# Patient Record
Sex: Female | Born: 1995 | Race: Black or African American | Hispanic: No | Marital: Single | Smoking: Never smoker
Health system: Southern US, Community
[De-identification: ages and names within clinical notes are randomized; demographics above are authoritative.]

## PROBLEM LIST (undated history)

## (undated) DIAGNOSIS — R569 Unspecified convulsions: Secondary | ICD-10-CM

---
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
## 2015-03-28 ENCOUNTER — Encounter (HOSPITAL_COMMUNITY): Payer: Self-pay | Admitting: Emergency Medicine

## 2015-03-28 ENCOUNTER — Emergency Department (HOSPITAL_COMMUNITY)
Admission: EM | Admit: 2015-03-28 | Discharge: 2015-03-29 | Disposition: A | Discharge status: Home / Self Care | Payer: Commercial Managed Care - PPO | Attending: Emergency Medicine | Admitting: Emergency Medicine

## 2015-03-28 DIAGNOSIS — H538 Other visual disturbances: Secondary | ICD-10-CM | POA: Diagnosis not present

## 2015-03-28 DIAGNOSIS — R0602 Shortness of breath: Secondary | ICD-10-CM | POA: Diagnosis not present

## 2015-03-28 DIAGNOSIS — Z3202 Encounter for pregnancy test, result negative: Secondary | ICD-10-CM | POA: Diagnosis not present

## 2015-03-28 DIAGNOSIS — G4700 Insomnia, unspecified: Secondary | ICD-10-CM | POA: Insufficient documentation

## 2015-03-28 DIAGNOSIS — M791 Myalgia, unspecified site: Secondary | ICD-10-CM

## 2015-03-28 DIAGNOSIS — R079 Chest pain, unspecified: Secondary | ICD-10-CM | POA: Insufficient documentation

## 2015-03-28 DIAGNOSIS — G40909 Epilepsy, unspecified, not intractable, without status epilepticus: Secondary | ICD-10-CM | POA: Insufficient documentation

## 2015-03-28 DIAGNOSIS — R42 Dizziness and giddiness: Secondary | ICD-10-CM | POA: Insufficient documentation

## 2015-03-28 DIAGNOSIS — R112 Nausea with vomiting, unspecified: Secondary | ICD-10-CM | POA: Diagnosis not present

## 2015-03-28 DIAGNOSIS — R109 Unspecified abdominal pain: Secondary | ICD-10-CM | POA: Diagnosis not present

## 2015-03-28 DIAGNOSIS — G4489 Other headache syndrome: Secondary | ICD-10-CM | POA: Insufficient documentation

## 2015-03-28 DIAGNOSIS — R110 Nausea: Secondary | ICD-10-CM

## 2015-03-28 HISTORY — DX: Unspecified convulsions: R56.9

## 2015-03-28 LAB — URINALYSIS, ROUTINE W REFLEX MICROSCOPIC
BILIRUBIN URINE: NEGATIVE
Glucose, UA: NEGATIVE mg/dL
HGB URINE DIPSTICK: NEGATIVE
Ketones, ur: 15 mg/dL — AB
NITRITE: NEGATIVE
PROTEIN: NEGATIVE mg/dL
Specific Gravity, Urine: 1.026 (ref 1.005–1.030)
UROBILINOGEN UA: 1 mg/dL (ref 0.0–1.0)
pH: 5.5 (ref 5.0–8.0)

## 2015-03-28 LAB — COMPREHENSIVE METABOLIC PANEL
ALBUMIN: 3.9 g/dL (ref 3.5–5.0)
ALK PHOS: 92 U/L (ref 38–126)
ALT: 18 U/L (ref 14–54)
ANION GAP: 13 (ref 5–15)
AST: 24 U/L (ref 15–41)
BILIRUBIN TOTAL: 0.4 mg/dL (ref 0.3–1.2)
BUN: 10 mg/dL (ref 6–20)
CALCIUM: 9.6 mg/dL (ref 8.9–10.3)
CO2: 23 mmol/L (ref 22–32)
Chloride: 100 mmol/L — ABNORMAL LOW (ref 101–111)
Creatinine, Ser: 1.04 mg/dL — ABNORMAL HIGH (ref 0.44–1.00)
GLUCOSE: 112 mg/dL — AB (ref 65–99)
POTASSIUM: 3.2 mmol/L — AB (ref 3.5–5.1)
Sodium: 136 mmol/L (ref 135–145)
TOTAL PROTEIN: 8 g/dL (ref 6.5–8.1)

## 2015-03-28 LAB — I-STAT BETA HCG BLOOD, ED (MC, WL, AP ONLY): I-stat hCG, quantitative: <5 m[IU]/mL (ref <5)

## 2015-03-28 LAB — LIPASE, BLOOD: Lipase: 41 U/L (ref 11–51)

## 2015-03-28 LAB — CBC
HCT: 37.7 % (ref 36.0–46.0)
HEMOGLOBIN: 12.6 g/dL (ref 12.0–15.0)
MCH: 31.4 pg (ref 26.0–34.0)
MCHC: 33.4 g/dL (ref 30.0–36.0)
MCV: 94 fL (ref 78.0–100.0)
Platelets: 400 10*3/uL (ref 150–400)
RBC: 4.01 MIL/uL (ref 3.87–5.11)
RDW: 12.7 % (ref 11.5–15.5)
WBC: 8.2 10*3/uL (ref 4.0–10.5)

## 2015-03-28 LAB — URINE MICROSCOPIC-ADD ON

## 2015-03-28 MED ORDER — ONDANSETRON 4 MG PO TBDP
8.0000 mg | ORAL_TABLET | Freq: Once | ORAL | Status: AC
  Administered 2015-03-29: 8 mg via ORAL
  Filled 2015-03-28: qty 2

## 2015-03-28 NOTE — ED Provider Notes (Signed)
CSN: [id number]     Arrival date & time 03/28/15  2137 History  By signing my name below, I, Tanda Rockers, attest that this documentation has been prepared under the direction and in the presence of Zadie Rhine, MD. Electronically Signed: Tanda Rockers, ED Scribe. 03/28/2015. 11:57 PM.  Chief Complaint  Patient presents with  . Nausea  . Insomnia  . Visual Field Change   The history is provided by the patient. No language interpreter was used.     HPI Comments: Hannah Shields is a 19 y.o. female with hx seizures who presents to the Emergency Department complaining of gradual onset, intermittent, severe headache, nausea, vomiting, intermittent blurry vision, jerking arm movements, dizziness, and insomnia x 1 week. Pt states that these symptoms usually precede her seizures and she is concerned she may have one again soon. Pt takes Lamictal for her seizures and states she has been complaint with it.  She notes 1 episode of double vision that occurred a few days ago as well. She also complains of intermittent mid chest pain, shortness of breath, and abdominal pain. Pt states that the chest pain has not been typical of symptoms prior to having a seizure. Pt spoke to her neurologist in [place] today and was told she should be evaluated if symptoms persist. She attends college in the area but has not established with a neurologist here. Denies vision loss, arthralgias, diarrhea, fever, numbness or weakness in extremities, or any other associated symptoms. Pt states she has been working out more frequently for the past week. No recent drug use or EtOH use.    Past Medical History  Diagnosis Date  . Seizures (HCC)    History reviewed. No pertinent past surgical history. Family History  Problem Relation Age of Onset  . Cancer Maternal Grandfather   . Seizures Paternal Grandmother   . Kidney disease Paternal Grandfather    Social History  Substance Use Topics  . Smoking status: Never Smoker    . Smokeless tobacco: None  . Alcohol Use: No   OB History    No data available     Review of Systems  Constitutional: Negative for fever.  Eyes: Positive for visual disturbance (Blurred vision. Double vision. ).       Negative for loss of vision  Respiratory: Positive for shortness of breath.   Cardiovascular: Positive for chest pain.  Gastrointestinal: Positive for nausea, vomiting and abdominal pain. Negative for diarrhea.  Genitourinary: Negative for vaginal bleeding and vaginal discharge.  Musculoskeletal: Negative for arthralgias.  Neurological: Positive for dizziness and headaches. Negative for seizures, weakness and numbness.       + Jerking arm movements  Psychiatric/Behavioral: Positive for sleep disturbance.  All other systems reviewed and are negative.  Allergies  Review of patient's allergies indicates no known allergies.  Home Medications   Prior to Admission medications   Medication Sig Start Date End Date Taking? Authorizing Provider  LamoTRIgine (LAMICTAL XR) 300 MG TB24 Take 1 tablet (300 mg total) by mouth at bedtime. 02/12/13  Yes Micki Riley, MD  PRESCRIPTION MEDICATION Take 1 tablet by mouth daily. Birth Control   Yes Historical Provider, MD   Triage Vitals: BP 122/77 mmHg  Pulse 97  Temp(Src) 98.4 F (36.9 C) (Oral)  Resp 11  Ht  (1.626 m)  Wt 176 lb 6.4 oz (80.015 kg)  BMI 30.26 kg/m2  SpO2 100%  LMP 03/10/2015 (Exact Date)   Physical Exam  Nursing note and vitals reviewed.  CONSTITUTIONAL:  Well developed/well nourished HEAD: Normocephalic/atraumatic EYES: EOMI/PERRL, no nystagmus, no ptosis, No visual field deficit  ENMT: Mucous membranes moist NECK: supple no meningeal signs, no bruits SPINE/BACK:entire spine nontender CV: S1/S2 noted, no murmurs/rubs/gallops noted LUNGS: Lungs are clear to auscultation bilaterally, no apparent distress ABDOMEN: soft, nontender, no rebound or guarding GU:no cva tenderness NEURO:Awake/alert, face  symmetric, no arm or leg drift is noted Equal 5/5 strength with shoulder abduction, elbow flex/extension, wrist flex/extension in upper extremities and equal hand grips bilaterally Equal 5/5 strength with hip flexion,knee flex/extension, foot dorsi/plantar flexion Cranial nerves 3/4/5/6/11/26/08/11/12 tested and intact Gait normal without ataxia No past pointing Sensation to light touch intact in all extremities EXTREMITIES: pulses normal, full ROM SKIN: warm, color normal PSYCH: no abnormalities of mood noted, alert and oriented to situation   ED Course  Procedures   DIAGNOSTIC STUDIES: Oxygen Saturation is 100% on RA, normal by my interpretation.    COORDINATION OF CARE: 11:54 PM-Discussed treatment plan which includes Zofran and CK with pt at bedside and pt agreed to plan.  1:14 AM Pt well appearing Watching TV and smiling No neuro deficits She is ambulatory No significant alteration in her labs Stable for d/c home Given neuro referral Given SZ precautions   Labs Review Labs Reviewed  COMPREHENSIVE METABOLIC PANEL - Abnormal; Notable for the following:    Potassium 3.2 (*)    Chloride 100 (*)    Glucose, Bld 112 (*)    Creatinine, Ser 1.04 (*)    All other components within normal limits  URINALYSIS, ROUTINE W REFLEX MICROSCOPIC (NOT AT [facility]) - Abnormal; Notable for the following:    APPearance CLOUDY (*)    Ketones, ur 15 (*)    Leukocytes, UA SMALL (*)    All other components within normal limits  URINE MICROSCOPIC-ADD ON - Abnormal; Notable for the following:    Squamous Epithelial / LPF FEW (*)    Bacteria, UA FEW (*)    All other components within normal limits  CK - Abnormal; Notable for the following:    Total CK 349 (*)    All other components within normal limits  LIPASE, BLOOD  CBC  I-STAT BETA HCG BLOOD, ED ([facility], [facility], [facility] ONLY)  I-STAT BETA HCG BLOOD, ED ([facility], [facility], [facility] ONLY)    I have personally reviewed and evaluated these lab results as part of my  medical decision-making.   EKG Interpretation   Date/Time:  Tuesday March 29 2015 01:05:16 EST Ventricular Rate:  80 PR Interval:  129 QRS Duration: 99 QT Interval:  349 QTC Calculation: 402 R Axis:   64 Text Interpretation:  Sinus rhythm RSR' in V1 or V2, right VCD or RVH ECG  OTHERWISE WITHIN NORMAL LIMITS No previous ECGs available Confirmed by  Bebe ShaggyWICKLINE  MD, Yahmir Sokolov ([id number]) on 03/29/2015 1:11:12 AM      MDM   Final diagnoses:  Nausea  Other headache syndrome  Non-intractable vomiting with nausea, vomiting of unspecified type  Myalgia  Chest pain, unspecified chest pain type  Abdominal pain, unspecified abdominal location    Nursing notes including past medical history and social history reviewed and considered in documentation Labs/vital reviewed myself and considered during evaluation Previous records reviewed and considered   I personally performed the services described in this documentation, which was scribed in my presence. The recorded information has been reviewed and is accurate.        Zadie Rhineonald Zhaire Locker, MD 03/29/15 [phone]

## 2015-03-28 NOTE — ED Notes (Signed)
Patient here with complaint of nausea, blurred vision, jerking arm movements, and insomnia. States history of epilepsy and takes Lamictal. Reports that she has not missed any doses. Presents tonight because the symptoms she is feeling typically precede her seizures. Onset was 1 week ago.

## 2015-03-29 ENCOUNTER — Other Ambulatory Visit: Payer: Self-pay | Admitting: Neurology

## 2015-03-29 ENCOUNTER — Telehealth: Payer: Self-pay | Admitting: Neurology

## 2015-03-29 ENCOUNTER — Other Ambulatory Visit (INDEPENDENT_AMBULATORY_CARE_PROVIDER_SITE_OTHER): Payer: Self-pay

## 2015-03-29 DIAGNOSIS — G40309 Generalized idiopathic epilepsy and epileptic syndromes, not intractable, without status epilepticus: Secondary | ICD-10-CM

## 2015-03-29 DIAGNOSIS — R569 Unspecified convulsions: Secondary | ICD-10-CM

## 2015-03-29 DIAGNOSIS — Z0289 Encounter for other administrative examinations: Secondary | ICD-10-CM

## 2015-03-29 LAB — CK: Total CK: 349 U/L — ABNORMAL HIGH (ref 38–234)

## 2015-03-29 MED ORDER — ONDANSETRON 8 MG PO TBDP: ORAL_TABLET | ORAL | Status: AC

## 2015-03-29 NOTE — Telephone Encounter (Signed)
I called the patient and left message on the listed number stating that I had ordered some lab work for her including lamotrigine level and we will try to see if she can be seen sooner than November 23 by one of my partners.

## 2015-03-29 NOTE — Telephone Encounter (Signed)
Pt called and states she is having blurry and double vision, dizziness, sleep deprivation , nausea . Pt states these are the same symptoms she had when she would start to have seizures. Pt is taking lamictal 300 mg ER once at night , Prescribed by Dr. Wynona CanesPeter Raffalli , her neurologist in [place]. She says she called his office and was told that she would get a call back today. Pt wants to be seen as soon as possible. The earliest appt is Nov. 23rd with Dr. Pearlean BrownieSethi. She says she needs something sooner than that because she will not be in town during the offered appt time. Please call and  advise [phone]

## 2015-03-29 NOTE — Telephone Encounter (Signed)
LFt vm for patient to return phone call. Pt would like to be seen sooner. Dr.Sethi does not have any availability. Dr.Sethi stated one of his counterparts can see the patient. Pt cancel her appt for 04-13-15. Dr. Lucia GaskinsAhern stated she can see the patient this week.

## 2015-03-29 NOTE — Telephone Encounter (Signed)
Rn receive incoming call from patient about an appt. Pt had appt on 04-13-15 but cancel it. Rn stated to patient that to not cancel appts until she [facility] Rn stated Dr.Sethi only had one available appt for this week. Rn explain to pt he has one new patient appt on 03-30-15 at 0900 and to arrive at [id number]. Pt stated she will be here. Pt having nausea and dizzy spells.

## 2015-03-29 NOTE — Telephone Encounter (Signed)
Rn call patient back if she can come in for lab work today. Pt has appt tomorrow for dizzy spells and nausea feeling. Pt is in school and is from [place]. Pt is here only for school and goes to [place] when school is out for break. Pt stated someone can drive her here today for labs and tomorrow for her appt.

## 2015-03-30 ENCOUNTER — Ambulatory Visit (INDEPENDENT_AMBULATORY_CARE_PROVIDER_SITE_OTHER): Payer: PRIVATE HEALTH INSURANCE | Admitting: Neurology

## 2015-03-30 ENCOUNTER — Encounter: Payer: Self-pay | Admitting: Neurology

## 2015-03-30 VITALS — BP 109/74 | HR 95 | Ht 64.0 in | Wt 175.5 lb

## 2015-03-30 DIAGNOSIS — G40309 Generalized idiopathic epilepsy and epileptic syndromes, not intractable, without status epilepticus: Secondary | ICD-10-CM

## 2015-03-30 NOTE — Progress Notes (Signed)
[facility] [address] [place] [place] [address] [phone]  OFFICE CONSULT NOTE  [name] Date of Birth: 05-17-1996 Medical Record Number: [id number]   Referring MD: Zadie Rhine  Reason for Referral: Epilepsy  HPI:19 year african american girl recently moved to [place] 2 years ago from [place] to study at [employer] here  She has epilepsy diagnosed since 2008.Seizures are mostly in sleep and she wakes up tongue bite and bruises. I have neurological records from Dr Quentin Cornwall, Pediatric Neurologist from [facility] hospital which state she has had seizure in April 2011 last related to being off lamictal for few days.EEG Oct 2008 showed frequent bursts of bifrontal spike and wave activity lasting 1-5 seconds brought on by photic stimulation hence despite being seizure free for several years lamictal was continued.Lamictal was started in 2009 and last EEG in March 2011 was normal.She was on 300 mg twice daily but is not sure why she is presently on Lamictal XR 300 mg hs only.She is tolerating this well without any side effects. She is now compliant with her medicines.She has normal birth, childhood milestones without any delays.She has no h/o head injury, febrile seizures or family history of epilepsy.MRI brain 03/19/07 was reportedly normal. She presented to the Emergency Department at [facility] on 03/28/15 complaining of gradual onset, intermittent, severe headache, nausea, vomiting, intermittent blurry vision, jerking arm movements, dizziness, and insomnia x 1 week. Pt states that these symptoms usually precede her seizures and she is concerned she may have one again soon. Pt takes Lamictal for her seizures and states she has been complaint with it. She notes 1 episode of double vision that occurred a few days ago as well. She also complains of intermittent mid chest pain, shortness of breath, and  abdominal pain. Pt states that the chest pain has not been typical of symptoms prior to having a seizure. She was supposed to take lamotrigine X are 300 mg 1 tablet at night when she last saw me more than 2 years ago but clearly has been taking 2 tablets instead of 1. She was advised to undergo an EEG at that visit but she did not schedule the test stating that her parents insisted they accompany her for the EEG. She did not keep any follow-up appointments with me. Lab work done on 03/28/15 included CBC and metabolic panel labs which were normal. She had lamotrigine level drawn yesterday but the results are not yet back. ROS:  14 system review of systems is positive for not enough sleep,skin sensitivity and acne, headache,sleepiness,tiredness only  PMH: Seizure disorder.  Social History:  History   Social History  . Marital Status: Single    Spouse Name: N/A    Number of Children: N/A  . Years of Education: N/A   Occupational History  . Not on file.   Social History Main Topics  . Smoking status: Never Smoker   . Smokeless tobacco: Not on file  . Alcohol Use: No  . Drug Use: No  . Sexual Activity: Not on file   Other Topics Concern  . Not on file   Social History Narrative  . No narrative on file    Medications:  No current outpatient prescriptions on file prior to visit.   No current facility-administered medications on file prior to visit.    Allergies: No Known Allergies Filed Vitals:   02/12/13 1318  BP: 114/72  Pulse: 101  Temp: 98.4 F (36.9 C)   Physical Exam General: well developed,  well nourished, seated, in no evident distress Head: head normocephalic and atraumatic. Orohparynx benign Neck: supple with no carotid or supraclavicular bruits Cardiovascular: regular rate and rhythm, no murmurs Musculoskeletal: no deformity Skin: no rash/petichiae. acne on face Vitiligo right lip and  chin Vascular: Normal pulses all extremities  Neurologic Exam Mental Status: Awake and fully alert. Oriented to place and time. Recent and remote memory intact. Attention span, concentration and fund of knowledge appropriate. Mood and affect appropriate.  Cranial Nerves: Fundoscopic exam reveals sharp disc margins. Pupils equal, briskly reactive to light. Extraocular movements full without nystagmus. Visual fields full to confrontation. Hearing intact. Facial sensation intact. Face, tongue, palate moves normally and symmetrically.  Motor: Normal bulk and tone. Normal strength in all tested extremity muscles. Sensory.: intact to touch and pinprick and vibratory.  Coordination: Rapid alternating movements normal in all extremities. Finger-to-nose and heel-to-shin performed accurately bilaterally. Gait and Station: Arises from chair without difficulty. Stance is normal. Gait demonstrates normal stride length and balance . Able to heel, toe and tandem walk without difficulty.  Reflexes: 1+ and symmetric. Toes downgoing.   Review of prior studies ; MRI brain 03/19/07 normal and EEG normal x 2 in 08/12/09 and 04/05/08 with 1 EEG showing generalized spikes and waves ( actual report not available)   ASSESSMENT: [age] year african [place] lady with primary generalized Epilepsy since 2008. Last provoked seizure in April 2011 due to noncompliance. Recent multifocal complaints of dizziness, blurred vision, diplopia and tiredness possibly related to lamotrigine toxicity.    PLAN:    I had a long discussion with the patient with regards to her history of seizure disorder and current multifocal symptoms of blurred vision, nausea, dizziness, tiredness and double vision possibly representing lamotrigine toxicity as she apparently is taking 2 tablets of lamotrigine 300 mg at night instead of 1 tablet which was prescribed at last visit. We are still awaiting lamotrigine levels. I recommend we reduce the  dose of lamotrigine to 300 mg 1 tablet at night and check an EEG which she had not done previously. She was advised to be compliant with her medications and to have regular sleeping habits and avoid seizure provoking stimuli. Greater than 50% time during this 30 minute consultation visit was spent on counseling and coordination of care. She was asked to return for follow-up in 2 months or call earlier if necessary.  Delia HeadyPramod Shanara Schnieders, MD

## 2015-03-30 NOTE — Patient Instructions (Addendum)
I had a long discussion with the patient with regards to her history of seizure disorder and current multifocal symptoms of blurred vision, nausea, dizziness, tiredness and double vision possibly representing lamotrigine toxicity as she apparently is taking 2 tablets of lamotrigine 300 mg at night instead of 1 tablet which was prescribed at last visit. We are still awaiting lamotrigine levels. I recommend we reduce the dose of lamotrigine to 300 mg 1 tablet at night and check an EEG which she had not done previously. She was advised to be compliant with her medications and to have regular sleeping habits and avoid seizure provoking stimuli. She was asked to return for follow-up in 2 months or call earlier if necessary.

## 2015-03-31 LAB — COMPREHENSIVE METABOLIC PANEL
ALBUMIN: 4.2 g/dL (ref 3.5–5.5)
ALT: 14 IU/L (ref 0–32)
AST: 19 IU/L (ref 0–40)
Albumin/Globulin Ratio: 1.3 (ref 1.1–2.5)
Alkaline Phosphatase: 104 IU/L (ref 39–117)
BUN / CREAT RATIO: 9 (ref 8–20)
BUN: 9 mg/dL (ref 6–20)
Bilirubin Total: 0.4 mg/dL (ref 0.0–1.2)
CALCIUM: 9.6 mg/dL (ref 8.7–10.2)
CO2: 24 mmol/L (ref 18–29)
CREATININE: 0.95 mg/dL (ref 0.57–1.00)
Chloride: 97 mmol/L (ref 97–106)
GFR, EST AFRICAN AMERICAN: 100 mL/min/{1.73_m2} (ref >59)
GFR, EST NON AFRICAN AMERICAN: 86 mL/min/{1.73_m2} (ref >59)
Globulin, Total: 3.3 g/dL (ref 1.5–4.5)
Glucose: 89 mg/dL (ref 65–99)
Potassium: 3.8 mmol/L (ref 3.5–5.2)
Sodium: 137 mmol/L (ref 136–144)
TOTAL PROTEIN: 7.5 g/dL (ref 6.0–8.5)

## 2015-03-31 LAB — CBC
HEMOGLOBIN: 12.4 g/dL (ref 11.1–15.9)
Hematocrit: 36.9 % (ref 34.0–46.6)
MCH: 31.2 pg (ref 26.6–33.0)
MCHC: 33.6 g/dL (ref 31.5–35.7)
MCV: 93 fL (ref 79–97)
PLATELETS: 429 10*3/uL — AB (ref 150–379)
RBC: 3.97 x10E6/uL (ref 3.77–5.28)
RDW: 13.1 % (ref 12.3–15.4)
WBC: 7.5 10*3/uL (ref 3.4–10.8)

## 2015-03-31 LAB — LAMOTRIGINE LEVEL: Lamotrigine Lvl: 16.7 ug/mL (ref 2.0–20.0)

## 2015-04-01 ENCOUNTER — Emergency Department (HOSPITAL_COMMUNITY)
Admission: EM | Admit: 2015-04-01 | Discharge: 2015-04-02 | Disposition: A | Discharge status: Home / Self Care | Payer: No Typology Code available for payment source | Attending: Emergency Medicine | Admitting: Emergency Medicine

## 2015-04-01 ENCOUNTER — Encounter (HOSPITAL_COMMUNITY): Payer: Self-pay | Admitting: Emergency Medicine

## 2015-04-01 DIAGNOSIS — R110 Nausea: Secondary | ICD-10-CM | POA: Diagnosis present

## 2015-04-01 DIAGNOSIS — Z79899 Other long term (current) drug therapy: Secondary | ICD-10-CM | POA: Insufficient documentation

## 2015-04-01 DIAGNOSIS — H538 Other visual disturbances: Secondary | ICD-10-CM | POA: Insufficient documentation

## 2015-04-01 DIAGNOSIS — R51 Headache: Secondary | ICD-10-CM | POA: Diagnosis not present

## 2015-04-01 DIAGNOSIS — G40909 Epilepsy, unspecified, not intractable, without status epilepticus: Secondary | ICD-10-CM | POA: Diagnosis not present

## 2015-04-01 DIAGNOSIS — Z3202 Encounter for pregnancy test, result negative: Secondary | ICD-10-CM | POA: Insufficient documentation

## 2015-04-01 NOTE — ED Notes (Signed)
Pt from home  C/o jerking movements, blurred vision, and headaches intermittently x week. She reports that she sometimes feels like this before she had a seizure. She was seen by PCP prescribed Zofran that she reports that it has helped some. Pt alert, and oriented and talking in full sentences. She takes Lamictal for seizures.

## 2015-04-02 ENCOUNTER — Emergency Department (HOSPITAL_COMMUNITY): Payer: No Typology Code available for payment source

## 2015-04-02 LAB — URINALYSIS, ROUTINE W REFLEX MICROSCOPIC
Glucose, UA: NEGATIVE mg/dL
KETONES UR: 15 mg/dL — AB
NITRITE: NEGATIVE
PH: 5 (ref 5.0–8.0)
Protein, ur: NEGATIVE mg/dL
Specific Gravity, Urine: 1.031 — ABNORMAL HIGH (ref 1.005–1.030)
Urobilinogen, UA: 1 mg/dL (ref 0.0–1.0)

## 2015-04-02 LAB — CBC
HCT: 38 % (ref 36.0–46.0)
HEMOGLOBIN: 12.5 g/dL (ref 12.0–15.0)
MCH: 31 pg (ref 26.0–34.0)
MCHC: 32.9 g/dL (ref 30.0–36.0)
MCV: 94.3 fL (ref 78.0–100.0)
PLATELETS: 413 10*3/uL — AB (ref 150–400)
RBC: 4.03 MIL/uL (ref 3.87–5.11)
RDW: 12.6 % (ref 11.5–15.5)
WBC: 7.9 10*3/uL (ref 4.0–10.5)

## 2015-04-02 LAB — LIPASE, BLOOD: LIPASE: 50 U/L (ref 11–51)

## 2015-04-02 LAB — URINE MICROSCOPIC-ADD ON

## 2015-04-02 LAB — COMPREHENSIVE METABOLIC PANEL
ALBUMIN: 4.3 g/dL (ref 3.5–5.0)
ALT: 14 U/L (ref 14–54)
AST: 19 U/L (ref 15–41)
Alkaline Phosphatase: 98 U/L (ref 38–126)
Anion gap: 9 (ref 5–15)
BUN: 9 mg/dL (ref 6–20)
CALCIUM: 9.7 mg/dL (ref 8.9–10.3)
CHLORIDE: 103 mmol/L (ref 101–111)
CO2: 27 mmol/L (ref 22–32)
Creatinine, Ser: 1.01 mg/dL — ABNORMAL HIGH (ref 0.44–1.00)
GFR calc Af Amer: >60 mL/min (ref >60)
GLUCOSE: 93 mg/dL (ref 65–99)
Potassium: 3.8 mmol/L (ref 3.5–5.1)
Sodium: 139 mmol/L (ref 135–145)
TOTAL PROTEIN: 8.5 g/dL — AB (ref 6.5–8.1)
Total Bilirubin: 0.3 mg/dL (ref 0.3–1.2)

## 2015-04-02 LAB — PREGNANCY, URINE: Preg Test, Ur: NEGATIVE

## 2015-04-02 MED ORDER — ONDANSETRON HCL 4 MG PO TABS: 4.0000 mg | ORAL_TABLET | Freq: Three times a day (TID) | ORAL | Status: AC | PRN

## 2015-04-02 MED ORDER — LORAZEPAM 0.5 MG PO TABS: ORAL_TABLET | ORAL | Status: AC

## 2015-04-02 MED ORDER — ONDANSETRON 8 MG PO TBDP
8.0000 mg | ORAL_TABLET | Freq: Once | ORAL | Status: AC
  Administered 2015-04-02: 8 mg via ORAL
  Filled 2015-04-02: qty 1

## 2015-04-02 NOTE — Discharge Instructions (Signed)
°Emergency Department Resource Guide °1) Find a Doctor and Pay Out of Pocket °Although you won't have to find out who is covered by your insurance plan, it is a good idea to ask around and get recommendations. You will then need to call the office and see if the doctor you have chosen will accept you as a new patient and what types of options they offer for patients who are self-pay. Some doctors offer discounts or will set up payment plans for their patients who do not have insurance, but you will need to ask so you aren't surprised when you get to your appointment. ° °2) Contact Your Local Health Department °Not all health departments have doctors that can see patients for sick visits, but many do, so it is worth a call to see if yours does. If you don't know where your local health department is, you can check in your phone book. The CDC also has a tool to help you locate your state's health department, and many state websites also have listings of all of their local health departments. ° °3) Find a Walk-in Clinic °If your illness is not likely to be very severe or complicated, you may want to try a walk in clinic. These are popping up all over the country in pharmacies, drugstores, and shopping centers. They're usually staffed by nurse practitioners or physician assistants that have been trained to treat common illnesses and complaints. They're usually fairly quick and inexpensive. However, if you have serious medical issues or chronic medical problems, these are probably not your best option. ° °No Primary Care Doctor: °- Call [facility] at  832-8000 - they can help you locate a primary care doctor that  accepts your insurance, provides certain services, etc. °- Physician Referral Service- 1-800-533-3463 ° °Chronic Pain Problems: °Organization         Address  Phone   Notes  °[facility]  (336) 297-2271 Patients need to be referred by their primary care doctor.  ° °Medication  Assistance: °Organization         Address  Phone   Notes  °[place] [facility] [address] °[place], [place] [address] (336) 641-8030 --Must be a resident of [place] °-- Must have NO insurance coverage whatsoever (no Medicaid/ Medicare, etc.) °-- The pt. MUST have a primary care doctor that directs their care regularly and follows them in the community °  °[facility]  (866) 331-1348   °[employer]  (888) 892-1162   ° °Agencies that provide inexpensive medical care: °Organization         Address  Phone   Notes  °[facility]  (336) 832-8035   °[facility]    (336) 832-7272   °[facility] [address] °[place], [place] [address] (336) 832-4777   °[facility] [address], °[place] (336) 271-4999   °[facility]    (336) 373-0678   °[facility]    (336) 272-1050   °[facility] ° [address], [place] Phone:  (336) 832-4444, Fax:  (336) 832-4440 Hours of Operation:  9 am - 6 pm, M-F.  Also accepts Medicaid/Medicare and self-pay.  °[facility] ° [address], [place] Phone: (336) [phone], Fax: (336) [phone]. Hours of Operation:  8:30 am - 5:30 pm, M-F.  Also accepts Medicaid and self-pay.  °[facility] [place] [address], [place] Phone: (336) 878-6027   °[facility] [address], [place], [place] (336)723-1848, Ext. 123 Mondays & Thursdays: 7-9 AM.  First 15 patients are seen on a first come, first serve basis. °  ° °Medicaid-accepting [place] Providers: ° °Organization         Address  Phone   Notes  °[facility] [address], [place] (336) 641-2100 Also accepts self-pay patients.  °[facility] [address], [place] ° (336) 856-9996   °[facility] [address], [place]  (336) 288-8857   °[facility] [address] [place] [address], [place] (336) 299-7000   °Veita Bland [address], [place]  ° (336) 373-1557 Only accepts [place] Access Medicaid patients after they have their name applied to their card.  ° °Self-Pay (no insurance) in [place]: ° °Organization         Address  Phone   Notes  °Sickle Cell Patients, [facility] [address], [place] (336) 832-1970   °[facility] [address], [place] (336) 832-4400   °[facility] ° [address], [place] (336) 992-4800   °[facility]/Dr. Osei-Bonsu ° [address] [place] [address], [place] or [address], [place] (336) 841-8500 Phone number for both [place] and [place] locations is the same.  °[facility] [address], [place] (336) 299-0000   °[facility] [address] [place] [address], [place] or [address] (336) 852-7530 °(336) 878-2260   °[facility] [address], [place] (336) 350-1642, phone; (336) 294-5005, fax Sees patients 1st and 3rd Saturday of every month.  Must not qualify for public or private insurance (i.e. Medicaid, Medicare, [place] Health Choice, Veterans' Benefits) • Household income should be no more than 200% of the poverty level •The clinic cannot treat you if you are pregnant or think you are pregnant • Sexually transmitted diseases are not treated at the clinic.  ° ° °Dental Care: °Organization         Address  Phone  Notes  °[place] [facility] [address], [place] (336) 641-6152 Accepts children up to age 21 who are enrolled in Medicaid or [place] Health Choice; pregnant women with a Medicaid card; and children who have applied for Medicaid or [place] Health Choice, but were declined, whose parents can pay a reduced fee at time of service.  °[place]  [facility] [place]  [address], [place] (336) 641-7733 Accepts children up to age 21 who are enrolled in Medicaid or [place] Health Choice; pregnant women with a Medicaid card; and children who have applied for Medicaid or [place] Health Choice, but were declined, whose parents can pay a reduced fee at time of service.  °[facility] ° [address], [place] (336) 641-4533 Patients are seen by appointment only. Walk-ins are not accepted. [facility] will see patients 18 years of age and older. °Monday - Tuesday (8am-5pm) °Most Wednesdays (8:30-5pm) °$30 per visit, cash only  °[facility] ° [address], [place] (336) 641-4533 Patients are seen by appointment only. Walk-ins are not accepted. [facility] will see patients 18 years of age and older. °One   Wednesday Evening (Monthly: Volunteer Based).  $30 per visit, cash only  °[facility]  (919) 537-3737 for adults; Children under age 4, call Graduate Pediatric Dentistry at ([phone]) [phone]. Children aged 4-14, please call (919) [phone] to request a pediatric application. ° Dental services are provided in all areas of dental care including fillings, crowns and bridges, complete and partial dentures, implants, gum treatment, root canals, and extractions. Preventive care is also provided. Treatment is provided to both adults and children. °Patients are selected via a lottery and there is often a waiting list. °  °[facility] [address], °[place] ° (336) 763-8833 [email or web address] °  °[facility] [address], [place], [place] (336)723-1848, Ext. 123 Second and Fourth Thursday of each month, opens at 6:30 AM; Clinic ends at 9 AM.  Patients are seen on a first-come first-served basis, and a limited number are seen during each clinic.  ° °[facility] ° [address], [place], [place] (336) 723-7904    Eligibility Requirements °You must have lived in [place], [place], or [place] counties for at least the last three months. °  You cannot be eligible for state or federal sponsored healthcare insurance, including Veterans Administration, Medicaid, or Medicare. °  You generally cannot be eligible for healthcare insurance through your employer.  °  How to apply: °Eligibility screenings are held every Tuesday and Wednesday afternoon from 1:00 pm until 4:00 pm. You do not need an appointment for the interview!  °[facility] [address], [place], [place] 336-631-2330   °[facility]  336-342-8273   °[facility]  336-703-3100   °[facility]  336-570-6415   ° °Behavioral Health Resources in the Community: °Intensive Outpatient Programs °Organization         Address  Phone  Notes  °[facility] [address], [place], [place] 336-878-6098   °[facility] [address], [place], [place] 336-832-9800   °[facility] [address], [place], [place] ° 336-882-2125   °[facility] [address],  °[place], [place] 1-800-853-5163 or 336-641-4981   °Substance Abuse Resources °Organization         Address  Phone  Notes  °[facility]  336-882-2125   °[facility]  336-784-9470   °[facility]  336-285-9073   °[facility]  336-845-3988   °Residential & Outpatient Substance Abuse Program  1-800-659-3381   °Psychological Services °Organization         Address  Phone  Notes  °[facility]  336- 832-9600   °[facility]  336- 378-7881   °[facility] [address], [place] 1-800-853-5163 or 336-641-4981   ° °Mobile Crisis Teams °Organization         Address  Phone  Notes  °[facility], Mobile Crisis Care Unit  1-877-626-1772   °Assertive °[facility] ° [address]  [place], [place] 336-834-9664   °Sharon DeEsch [address] °[place] 336-554-5454   ° °Self-Help/Support Groups °Organization         Address  Phone             Notes  °[facility] of  - variety of support groups  336- 373-1402 Call for more information  °Narcotics Anonymous (NA), [facility] [address], °[place]  2 meetings at this location  ° °  Residential Treatment Programs Organization         Address  Phone  Notes  [facility] [address],    [place]  [phone]   [facility]  [address], [place] [phone], [place], [place] [phone]   [facility] [address], [place] [phone] Admissions: 8am-3pm M-F  [facility] [address]    [place], [place] [phone]   [facility] [address], [place], [place] [phone]   [facility] [address]  [place], [place] [phone]   [facility] Intensive Outpatient [address], [place], [place] [phone]   [facility] ([facility] [address]  [place], [place] [phone]) [phone] or [phone]) [phone]   [facility] ([facility] [address] [place], [place] [phone] Accepts Medicaid  [facility] [address]  [place] [phone] Substance Abuse/Addiction Treatment   [facility]         Address  Phone  Notes  [facility]  (717) 138-5764(888) [phone]   Angie FavaJulie Brannon, PhD [address], [address] [place], [place]   [phone](336) [phone] or [phone](336) [phone]   [facility]   [address] [place], [place] [phone](336) [phone]   [facility] [address], [place], [place] [phone](336) [phone] Insurance/Medicaid/sponsorship through [facility]  [facility] [address]                                    [place], [place] [phone](336) [phone] Therapy/tele-psych/case    [facility] [address]   [place], [place] [phone]    Dr. Lolly MustacheArfeen  [phone]) [phone]   [facility]  [employer] [facility] 1) [address], [place] 2) [address], [place] 3)  [address], [place] [phone]) [phone](336) [phone]  [phone]) [phone]   [facility] [phone](336) [phone] or [phone](336) [phone] (After Hours)      Take the prescriptions as directed.  Call your regular Neurologist on Monday to schedule a follow up appointment within the next 3 to 4 days.  Return to the Emergency Department immediately sooner if worsening.

## 2015-04-02 NOTE — ED Provider Notes (Signed)
CSN: [id number]     Arrival date & time 04/01/15  2309 History   First MD Initiated Contact with Patient 04/02/15 0118     Chief Complaint  Patient presents with  . Nausea  . Headache      HPI  Pt was seen at 0155. Per pt, c/o gradual onset and persistence of constant multiple symptoms for the past 2 weeks.  Symptoms include: blurry vision, nausea, "jerking movements," anterior "aching" headache. Pt describes her symptoms as "like before I had a seizure once." Pt endorses taking her lamictal as prescribed. Pt has been evaluated by her Neuro MD, as well as the ED, previously for these same symptoms. Pt states she "got zofran and that worked" to improve all her symptoms. Denies seizure activity, no vomiting/diarrhea, no CP/SOB, no abd pain, no visual changes, no focal motor weakness, no tingling/numbness in extremities, no ataxia, no slurred speech, no facial droop.    Past Medical History  Diagnosis Date  . Seizures (HCC)    History reviewed. No pertinent past surgical history.   Family History  Problem Relation Age of Onset  . Cancer Maternal Grandfather   . Seizures Paternal Grandmother   . Kidney disease Paternal Grandfather   . Migraines Father   . Hypertension Father    Social History  Substance Use Topics  . Smoking status: Never Smoker   . Smokeless tobacco: None  . Alcohol Use: No    Review of Systems ROS: Statement: All systems negative except as marked or noted in the HPI; Constitutional: Negative for fever and chills. ; ; Eyes: Negative for eye pain, redness and discharge. ; ; ENMT: Negative for ear pain, hoarseness, nasal congestion, sinus pressure and sore throat. ; ; Cardiovascular: Negative for chest pain, palpitations, diaphoresis, dyspnea and peripheral edema. ; ; Respiratory: Negative for cough, wheezing and stridor. ; ; Gastrointestinal: +nausea. Negative for vomiting, diarrhea, abdominal pain, blood in stool, hematemesis, jaundice and rectal bleeding. . ; ;  Genitourinary: Negative for dysuria, flank pain and hematuria. ; ; Musculoskeletal: Negative for back pain and neck pain. Negative for swelling and trauma.; ; Skin: Negative for pruritus, rash, abrasions, blisters, bruising and skin lesion.; ; Neuro: +"jerking movements," headache, blurry vision. Negative for lightheadedness and neck stiffness. Negative for weakness, altered level of consciousness , altered mental status, extremity weakness, paresthesias, seizure and syncope.      Allergies  Review of patient's allergies indicates no known allergies.  Home Medications   Prior to Admission medications   Medication Sig Start Date End Date Taking? Authorizing Provider  LamoTRIgine (LAMICTAL XR) 300 MG TB24 Take 1 tablet (300 mg total) by mouth at bedtime. Patient taking differently: Take 600 mg by mouth at bedtime.  02/12/13  Yes Micki Riley, MD  ondansetron (ZOFRAN ODT) 8 MG disintegrating tablet  ODT q4 hours prn nausea Patient taking differently: Take 8 mg by mouth every 4 (four) hours as needed for nausea.  ODT q4 hours prn nausea 03/29/15  Yes Zadie Rhine, MD  PRESCRIPTION MEDICATION Take 1 tablet by mouth daily. Birth Control   Yes Historical Provider, MD   BP 115/74 mmHg  Pulse 73  Temp(Src) 98.7 F (37.1 C) (Oral)  Resp 14  Ht  (1.626 m)  Wt 170 lb (77.111 kg)  BMI 29.17 kg/m2  SpO2 100%  LMP 03/10/2015 (Exact Date) Physical Exam  0200: Physical examination:  Nursing notes reviewed; Vital signs and O2 SAT reviewed;  Constitutional: Well developed, Well nourished, Well hydrated, In  no acute distress; Head:  Normocephalic, atraumatic; Eyes: EOMI, PERRL, No scleral icterus; ENMT: Mouth and pharynx normal, Mucous membranes moist; Neck: Supple, Full range of motion, No lymphadenopathy; Cardiovascular: Regular rate and rhythm, No murmur, rub, or gallop; Respiratory: Breath sounds clear & equal bilaterally, No rales, rhonchi, wheezes.  Speaking full sentences with ease,  Normal respiratory effort/excursion; Chest: Nontender, Movement normal; Abdomen: Soft, Nontender, Nondistended, Normal bowel sounds; Genitourinary: No CVA tenderness; Extremities: Pulses normal, No tenderness, No edema, No calf edema or asymmetry.; Neuro: AA&Ox3, Major CN grossly intact. No facial droop. Speech clear. No gross focal motor or sensory deficits in extremities. Climbs on and off stretcher easily by herself. Gait steady. No tremor or jerking movements noted during exam.; Skin: Color normal, Warm, Dry.; Psych:  Affect flat, poor eye contact.    ED Course  Procedures (including critical care time) Labs Review   Imaging Review  I have personally reviewed and evaluated these images and lab results as part of my medical decision-making.   EKG Interpretation None      MDM  MDM Reviewed: previous chart, nursing note and vitals Reviewed previous: labs Interpretation: labs and CT scan      Results for orders placed or performed during the hospital encounter of 04/01/15  Lipase, blood  Result Value Ref Range   Lipase 50 11 - 51 U/L  Comprehensive metabolic panel  Result Value Ref Range   Sodium 139 135 - 145 mmol/L   Potassium 3.8 3.5 - 5.1 mmol/L   Chloride 103 101 - 111 mmol/L   CO2 27 22 - 32 mmol/L   Glucose, Bld 93 65 - 99 mg/dL   BUN 9 6 - 20 mg/dL   Creatinine, Ser 9.60 (H) 0.44 - 1.00 mg/dL   Calcium 9.7 8.9 - 45.4 mg/dL   Total Protein 8.5 (H) 6.5 - 8.1 g/dL   Albumin 4.3 3.5 - 5.0 g/dL   AST 19 15 - 41 U/L   ALT 14 14 - 54 U/L   Alkaline Phosphatase 98 38 - 126 U/L   Total Bilirubin 0.3 0.3 - 1.2 mg/dL   GFR calc non Af Amer >60 >60 mL/min   GFR calc Af Amer >60 >60 mL/min   Anion gap 9 5 - 15  CBC  Result Value Ref Range   WBC 7.9 4.0 - 10.5 K/uL   RBC 4.03 3.87 - 5.11 MIL/uL   Hemoglobin 12.5 12.0 - 15.0 g/dL   HCT 09.8 11.9 - 14.7 %   MCV 94.3 78.0 - 100.0 fL   MCH 31.0 26.0 - 34.0 pg   MCHC 32.9 30.0 - 36.0 g/dL   RDW 82.9 56.2 - 13.0 %    Platelets 413 (H) 150 - 400 K/uL  Urinalysis, Routine w reflex microscopic (not at [facility])  Result Value Ref Range   Color, Urine AMBER (A) YELLOW   APPearance CLEAR CLEAR   Specific Gravity, Urine 1.031 (H) 1.005 - 1.030   pH 5.0 5.0 - 8.0   Glucose, UA NEGATIVE NEGATIVE mg/dL   Hgb urine dipstick TRACE (A) NEGATIVE   Bilirubin Urine SMALL (A) NEGATIVE   Ketones, ur 15 (A) NEGATIVE mg/dL   Protein, ur NEGATIVE NEGATIVE mg/dL   Urobilinogen, UA 1.0 0.0 - 1.0 mg/dL   Nitrite NEGATIVE NEGATIVE   Leukocytes, UA SMALL (A) NEGATIVE  Pregnancy, urine  Result Value Ref Range   Preg Test, Ur NEGATIVE NEGATIVE  Urine microscopic-add on  Result Value Ref Range   Squamous Epithelial / LPF  FEW (A) RARE   WBC, UA 3-6 <3 WBC/hpf   Bacteria, UA FEW (A) RARE   Urine-Other MUCOUS PRESENT    Ct Head Wo Contrast 04/02/2015  CLINICAL DATA:  Initial evaluation for acute seizure. EXAM: CT HEAD WITHOUT CONTRAST TECHNIQUE: Contiguous axial images were obtained from the base of the skull through the vertex without intravenous contrast. COMPARISON:  None. FINDINGS: There is no acute intracranial hemorrhage or infarct. No mass lesion or midline shift. Gray-white matter differentiation is well maintained. Ventricles are normal in size without evidence of hydrocephalus. CSF containing spaces are within normal limits. No extra-axial fluid collection. The calvarium is intact. Orbital soft tissues are within normal limits. The paranasal sinuses and mastoid air cells are well pneumatized and free of fluid. Scalp soft tissues are unremarkable. IMPRESSION: Normal head CT with no acute intracranial process identified. Electronically Signed   By: Rise MuBenjamin  McClintock M.D.   On: 04/02/2015 00:36    0345:  Pt with multiple complaints today; all same as previous ED visit as well as Neuro MD office visit on 03/30/15. Lamictal level drawn at that time was normal. Pt requested and given zofran. Has tol PO well without N/V. Neuro  exam remains intact and unchanged. No "jerking movements" from pt noted while in the ED. Pt strongly encouraged to f/u with her Neuro MD for good continuity of care and control of her chronic medical condition. Verb understanding. Pt states she feels better and wants to go home now. Dx and testing d/w pt.  Questions answered.  Verb understanding, agreeable to d/c home with outpt f/u.    Samuel JesterKathleen Dane Kopke, DO 04/04/15 1501

## 2015-04-08 ENCOUNTER — Emergency Department (HOSPITAL_COMMUNITY)
Admission: EM | Admit: 2015-04-08 | Discharge: 2015-04-08 | Disposition: A | Discharge status: Home / Self Care | Payer: No Typology Code available for payment source | Attending: Emergency Medicine | Admitting: Emergency Medicine

## 2015-04-08 ENCOUNTER — Encounter (HOSPITAL_COMMUNITY): Payer: Self-pay | Admitting: Emergency Medicine

## 2015-04-08 DIAGNOSIS — G40909 Epilepsy, unspecified, not intractable, without status epilepticus: Secondary | ICD-10-CM | POA: Insufficient documentation

## 2015-04-08 DIAGNOSIS — R51 Headache: Secondary | ICD-10-CM | POA: Diagnosis present

## 2015-04-08 DIAGNOSIS — Z79899 Other long term (current) drug therapy: Secondary | ICD-10-CM | POA: Diagnosis not present

## 2015-04-08 DIAGNOSIS — Z3202 Encounter for pregnancy test, result negative: Secondary | ICD-10-CM | POA: Diagnosis not present

## 2015-04-08 LAB — I-STAT CHEM 8, ED
BUN: 6 mg/dL (ref 6–20)
CALCIUM ION: 1.1 mmol/L — AB (ref 1.12–1.23)
CHLORIDE: 100 mmol/L — AB (ref 101–111)
Creatinine, Ser: 1 mg/dL (ref 0.44–1.00)
Glucose, Bld: 98 mg/dL (ref 65–99)
HEMATOCRIT: 35 % — AB (ref 36.0–46.0)
Hemoglobin: 11.9 g/dL — ABNORMAL LOW (ref 12.0–15.0)
Potassium: 3.2 mmol/L — ABNORMAL LOW (ref 3.5–5.1)
SODIUM: 141 mmol/L (ref 135–145)
TCO2: 24 mmol/L (ref 0–100)

## 2015-04-08 LAB — I-STAT BETA HCG BLOOD, ED (MC, WL, AP ONLY)

## 2015-04-08 NOTE — ED Notes (Signed)
Patient c/o headache and nausea. Patient also reports she feels dizzy when standing or walking. Patient reports she has a history of seizures that occur in her sleep. Patient states this is how she typically feels after a seizure, and thinks she may have had a seizure this am. Patient states she was brought to the ER by a friend this am. Patient is alert & oriented x4, speech is slow.

## 2015-04-08 NOTE — ED Notes (Addendum)
Per pt states friends saw her "shaking in her sleep around 4:15 am." Pt reports postictial state consist of dizziness, nausea, and headache. Reports took her lamictal as scheduled; last dose last night.

## 2015-04-08 NOTE — Discharge Instructions (Signed)

## 2015-04-08 NOTE — ED Provider Notes (Signed)
CSN: [id number]     Arrival date & time 04/08/15  0745 History   First MD Initiated Contact with Patient 04/08/15 [phone]     Chief Complaint  Patient presents with  . Headache  . Nausea   HPI Patient presents to the emergency room for evaluation after the shaking episode that occurred in her sleep. Patient has a history of seizure disorder. She says that most the time her seizures occur at night. A friend of hers noticed that around 4:15 in the morning she was shaking in her sleep. When the patient woke up this morning she was feeling dizzy and nauseated. She also had a mild headache. These are similar symptoms that she's had in the past after having had a seizure at night. Patient denies any trouble with any numbness or weakness. She denies any trouble with fevers or chills. No neck pain or stiffness. She has been having recent issues with possible seizures. She has been seen in the emergency room a couple of times this month. Patient is also seen her neurologist in follow-up. She had a Lamictal level drawn that was therapeutic. Posterior have a follow-up EEG procedure. Does not had that done yet. She is concerned about her finals that she has to do this week. Past Medical History  Diagnosis Date  . Seizures (HCC)    History reviewed. No pertinent past surgical history. Family History  Problem Relation Age of Onset  . Cancer Maternal Grandfather   . Seizures Paternal Grandmother   . Kidney disease Paternal Grandfather   . Migraines Father   . Hypertension Father    Social History  Substance Use Topics  . Smoking status: Never Smoker   . Smokeless tobacco: None  . Alcohol Use: No   OB History    No data available     Review of Systems  All other systems reviewed and are negative.     Allergies  Review of patient's allergies indicates no known allergies.  Home Medications   Prior to Admission medications   Medication Sig Start Date End Date Taking? Authorizing Provider   LamoTRIgine (LAMICTAL XR) 300 MG TB24 Take 1 tablet (300 mg total) by mouth at bedtime. Patient taking differently: Take 600 mg by mouth at bedtime.  02/12/13  Yes Micki Riley, MD  LORazepam (ATIVAN) 0.5 MG tablet One half to 1 tablet by mouth BID prn seizure 04/02/15  Yes Samuel Jester, DO  ondansetron (ZOFRAN ODT) 8 MG disintegrating tablet  ODT q4 hours prn nausea Patient taking differently: Take 8 mg by mouth every 4 (four) hours as needed for nausea.  ODT q4 hours prn nausea 03/29/15  Yes Zadie Rhine, MD  PRESCRIPTION MEDICATION Take 1 tablet by mouth daily. Birth Control   Yes Historical Provider, MD  ondansetron (ZOFRAN) 4 MG tablet Take 1 tablet (4 mg total) by mouth every 8 (eight) hours as needed for nausea or vomiting. Patient not taking: Reported on 04/08/2015 04/02/15   Samuel Jester, DO   BP 120/77 mmHg  Pulse 80  Temp(Src) 97.9 F (36.6 C) (Oral)  Resp 16  SpO2 99%  LMP 04/02/2015 (Approximate) Physical Exam  Constitutional: She appears well-developed and well-nourished. No distress.  HENT:  Head: Normocephalic and atraumatic.  Right Ear: External ear normal.  Left Ear: External ear normal.  Eyes: Conjunctivae are normal. Right eye exhibits no discharge. Left eye exhibits no discharge. No scleral icterus.  Neck: Neck supple. No tracheal deviation present.  Cardiovascular: Normal rate, regular rhythm and  intact distal pulses.   Pulmonary/Chest: Effort normal and breath sounds normal. No stridor. No respiratory distress. She has no wheezes. She has no rales.  Abdominal: Soft. Bowel sounds are normal. She exhibits no distension. There is no tenderness. There is no rebound and no guarding.  Musculoskeletal: She exhibits no edema or tenderness.  Neurological: She is alert. She has normal strength. No cranial nerve deficit (no facial droop, extraocular movements intact, no slurred speech) or sensory deficit. She exhibits normal muscle tone. She displays no  seizure activity. Coordination normal.  Skin: Skin is warm and dry. No rash noted.  Psychiatric: She has a normal mood and affect.  Nursing note and vitals reviewed.   ED Course  Procedures (including critical care time) Labs Review Labs Reviewed  I-STAT CHEM 8, ED - Abnormal; Notable for the following:    Potassium 3.2 (*)    Chloride 100 (*)    Calcium, Ion 1.10 (*)    Hemoglobin 11.9 (*)    HCT 35.0 (*)    All other components within normal limits  I-STAT BETA HCG BLOOD, ED ([facility], [facility], [facility] ONLY)    rsonally reviewed and evaluated these lab results as part of my medical decision-making.    MDM   Final diagnoses:  Seizure disorder [facility](HCC)   Patient has history of seizure disorder. She feels as if she had another one in her sleep last night. The patient's alert awake here. Her neurologic exam is normal. She has not had any recurrent seizure activity.  I reviewed the notes from her recent visit with Dr. Pearlean BrownieSethi.  Plan was to have a follow-up EEG.  I do not think the patient needs to have her medications changed at this time. I suspect an EEG will be helpful to determine if she may be having seizures despite her therapeutic drug levels.  Patient also plans on seeing [place] neurologist in [place] next week when she returns home.  At this time there does not appear to be any evidence of an acute emergency medical condition and the patient appears stable for discharge with appropriate outpatient follow up.      Linwood DibblesJon Janitza Revuelta, MD 04/08/15 1000

## 2015-04-19 ENCOUNTER — Telehealth: Payer: Self-pay

## 2015-04-19 NOTE — Telephone Encounter (Signed)
Lamotrigine ( seizure medicine she is taking ) level is in normal range. Continue with my stated plan unless she is worse or has had seizures. Kindly call patient and let her know

## 2015-04-20 NOTE — Telephone Encounter (Signed)
Mom called to advise patient had EEG in [place] last week 04/12/15, results are to be sent to our office.

## 2015-04-20 NOTE — Telephone Encounter (Signed)
Phone call to Mom to advise EEG appointment cancelled and to advise EEG results haven't been received in our office per Lady GaryKatrina, Mom will call to have EEG results faxed to our office.

## 2015-04-20 NOTE — Telephone Encounter (Signed)
Rn call patient and notify her that the lamotrigine levels were normal and continue treatment plan, and keep appt with the EEG on 05-03-15. Pt verbalized understanding, and will get EEG on 05-03-15.

## 2015-05-03 ENCOUNTER — Other Ambulatory Visit: Payer: PRIVATE HEALTH INSURANCE

## 2016-03-20 IMAGING — CT CT HEAD W/O CM
2 series · 16 of 30 positions shown, 20 images · non-contrast
Comparison: None.

CLINICAL DATA: Initial evaluation for acute seizure.

EXAM:
CT HEAD WITHOUT CONTRAST
TECHNIQUE: Contiguous axial images were obtained from the base of the skull
through the vertex without intravenous contrast.

[Series 2: head w/o · axial · non-contrast · 0.45mm/px · z∈[-188,-68]mm · 13 of 28 slices shown, 17 images]
[im 2/28  brain]
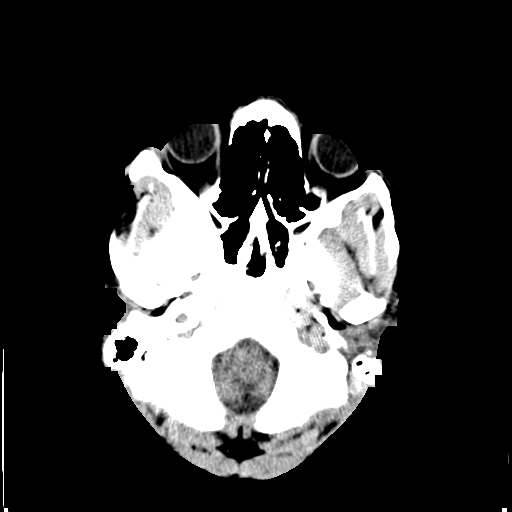
[im 2/28  bone]
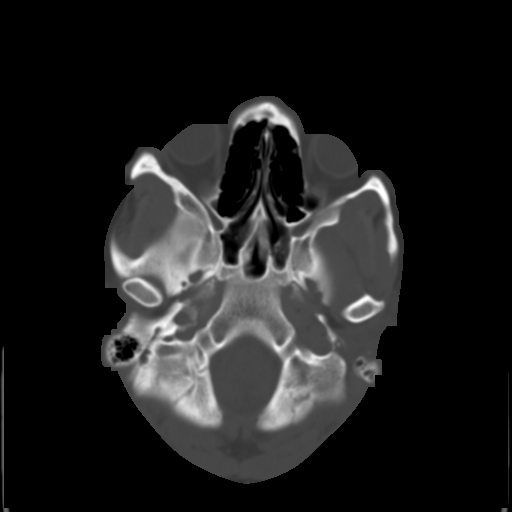
[im 4/28  brain]
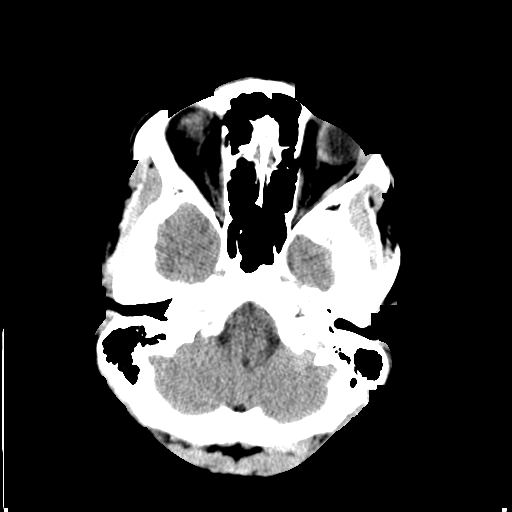
[im 6/28  brain]
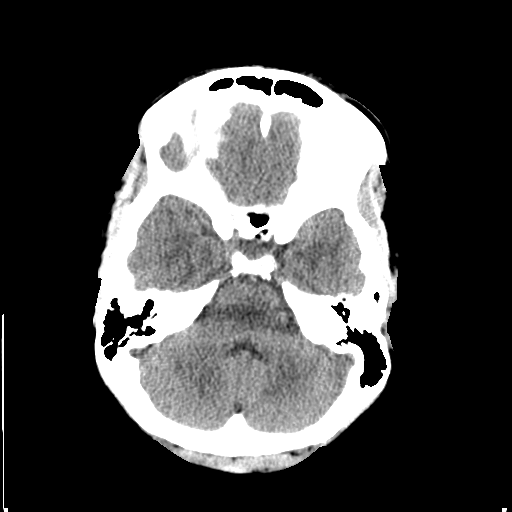
[im 8/28  brain]
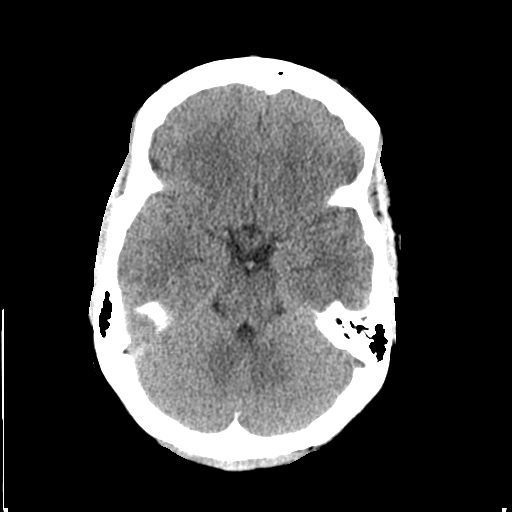
[im 10/28  brain]
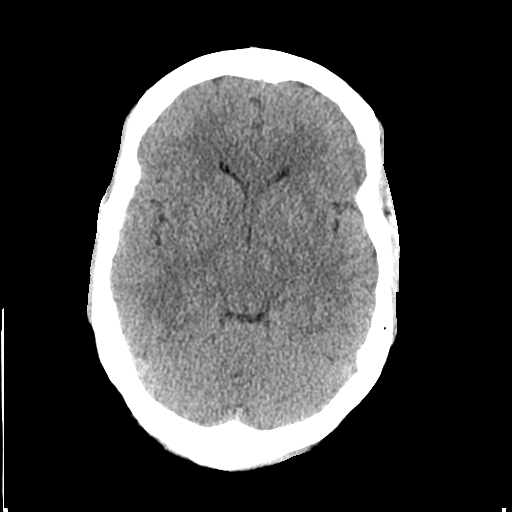
[im 10/28  bone]
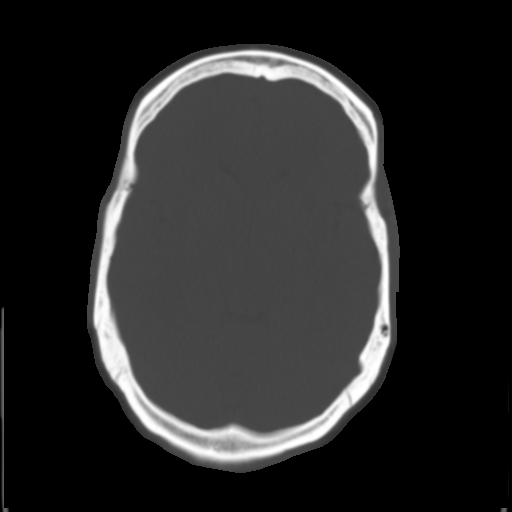
[im 12/28  brain]
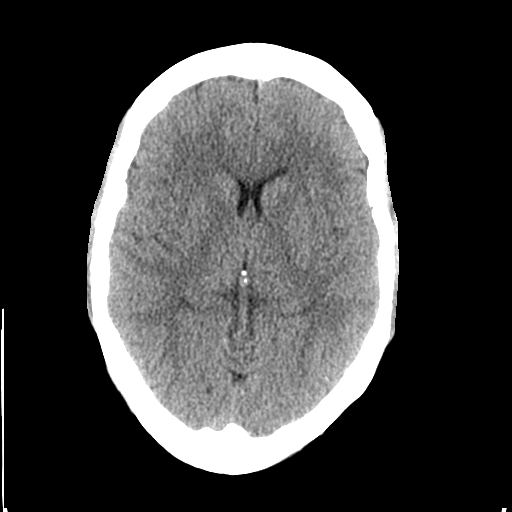
[im 14/28  brain]
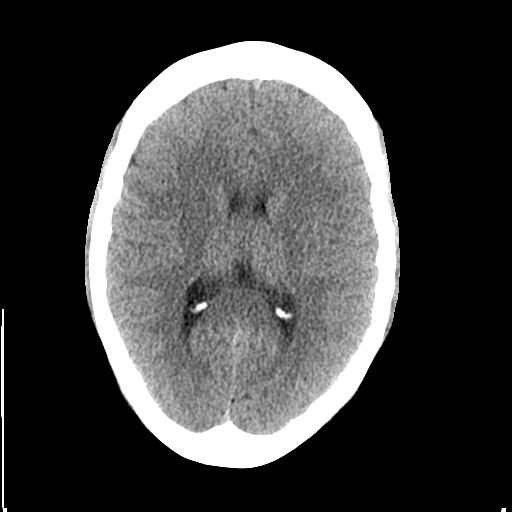
[im 16/28  brain]
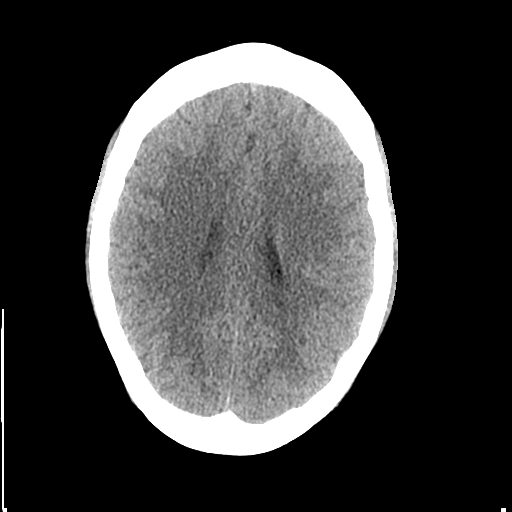
[im 18/28  brain]
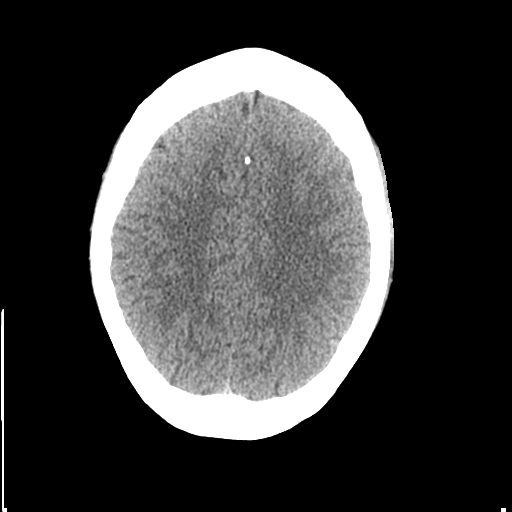
[im 18/28  bone]
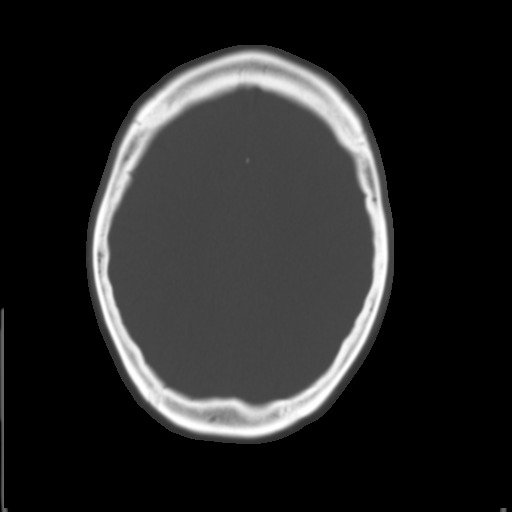
[im 20/28  brain]
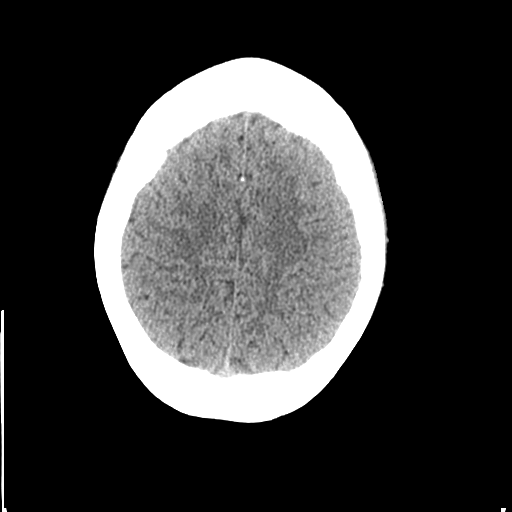
[im 22/28  brain]
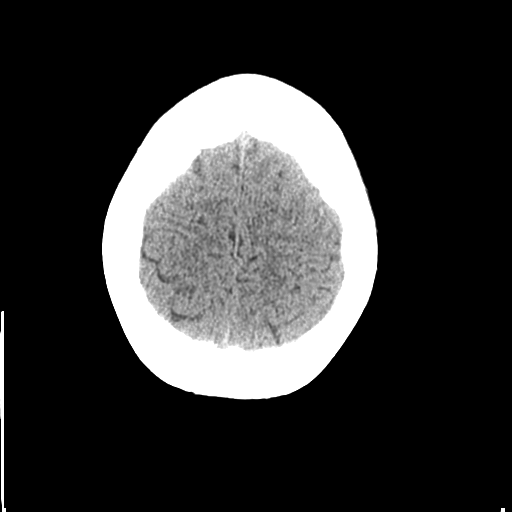
[im 24/28  brain]
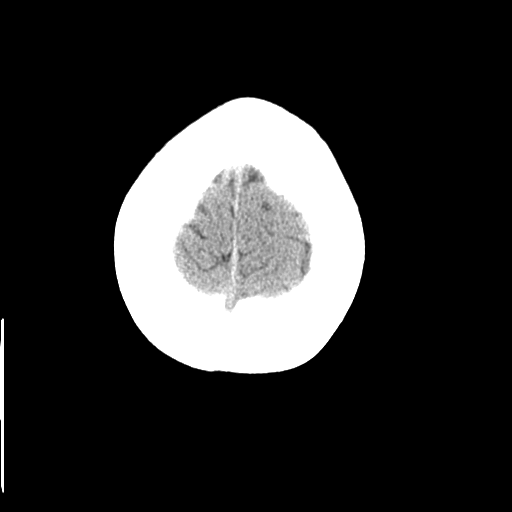
[im 26/28  brain]
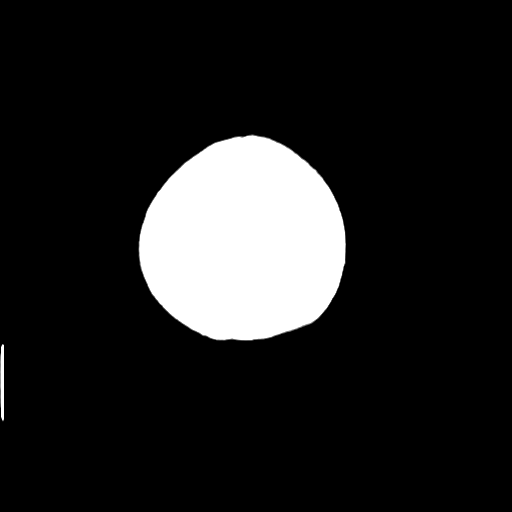
[im 26/28  bone]
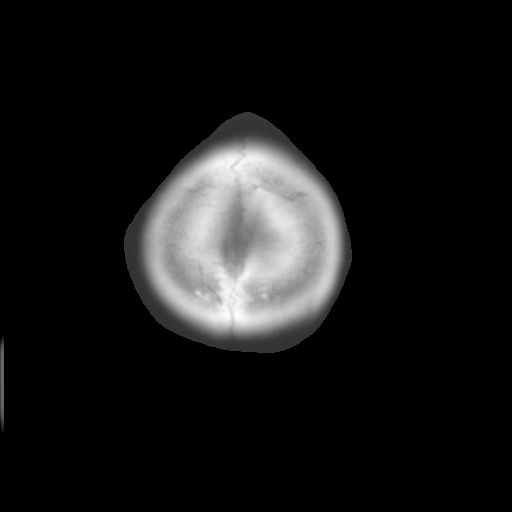

[Series 3: bone windows · axial · 0.45mm/px · z∈[-188,-148]mm · 3 of 28 slices shown]
[im 2/28  bone]
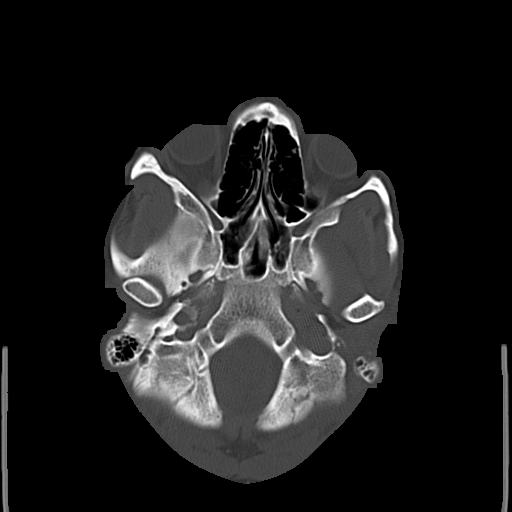
[im 6/28  bone]
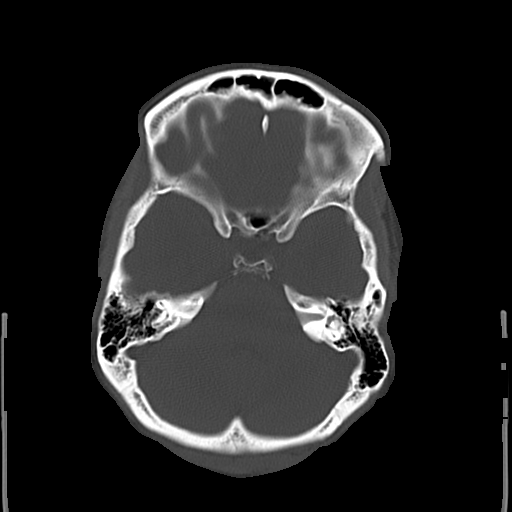
[im 10/28  bone]
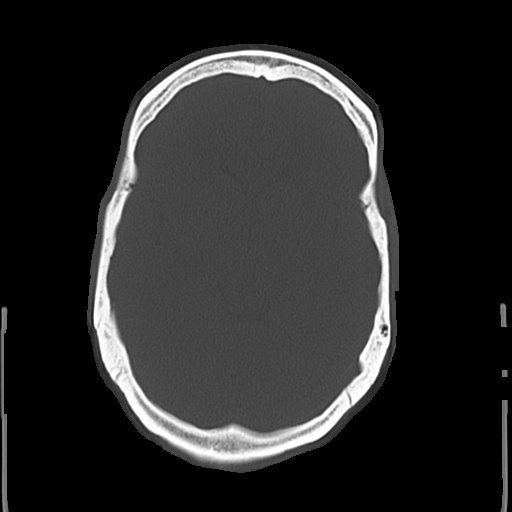

[16 of 30 positions shown; findings below may reference images not displayed]

FINDINGS: There is no acute intracranial hemorrhage or infarct. No mass lesion
or midline shift. Gray-white matter differentiation is well
maintained. Ventricles are normal in size without evidence of
hydrocephalus. CSF containing spaces are within normal limits. No
extra-axial fluid collection.

The calvarium is intact.

Orbital soft tissues are within normal limits.

The paranasal sinuses and mastoid air cells are well pneumatized and
free of fluid.

Scalp soft tissues are unremarkable.
IMPRESSION: Normal head CT with no acute intracranial process identified.
